# Patient Record
Sex: Female | Born: 1970 | Race: White | Hispanic: No | Marital: Married
Health system: Southern US, Community
[De-identification: ages and names within clinical notes are randomized; demographics above are authoritative.]

---

## 1998-06-27 ENCOUNTER — Other Ambulatory Visit: Admission: RE | Admit: 1998-06-27 | Discharge: 1998-06-27 | Payer: Self-pay | Admitting: Family Medicine

## 1999-06-19 ENCOUNTER — Other Ambulatory Visit: Admission: RE | Admit: 1999-06-19 | Discharge: 1999-06-19 | Payer: Self-pay | Admitting: Obstetrics & Gynecology

## 2000-07-24 ENCOUNTER — Inpatient Hospital Stay (HOSPITAL_COMMUNITY): Admission: AD | Admit: 2000-07-24 | Discharge: 2000-07-26 | Payer: Self-pay | Admitting: Obstetrics & Gynecology

## 2000-07-27 ENCOUNTER — Encounter: Admission: RE | Admit: 2000-07-27 | Discharge: 2000-08-26 | Payer: Self-pay | Admitting: Obstetrics and Gynecology

## 2000-08-28 ENCOUNTER — Other Ambulatory Visit: Admission: RE | Admit: 2000-08-28 | Discharge: 2000-08-28 | Payer: Self-pay | Admitting: Obstetrics and Gynecology

## 2000-09-21 ENCOUNTER — Encounter: Admission: RE | Admit: 2000-09-21 | Discharge: 2000-10-21 | Payer: Self-pay | Admitting: Obstetrics and Gynecology

## 2001-10-29 ENCOUNTER — Other Ambulatory Visit: Admission: RE | Admit: 2001-10-29 | Discharge: 2001-10-29 | Payer: Self-pay | Admitting: Obstetrics and Gynecology

## 2003-01-10 ENCOUNTER — Other Ambulatory Visit: Admission: RE | Admit: 2003-01-10 | Discharge: 2003-01-10 | Payer: Self-pay | Admitting: Obstetrics and Gynecology

## 2004-02-05 ENCOUNTER — Other Ambulatory Visit: Admission: RE | Admit: 2004-02-05 | Discharge: 2004-02-05 | Payer: Self-pay | Admitting: Obstetrics and Gynecology

## 2004-02-07 ENCOUNTER — Ambulatory Visit: Payer: Self-pay | Admitting: Internal Medicine

## 2007-04-13 ENCOUNTER — Other Ambulatory Visit: Admission: RE | Admit: 2007-04-13 | Discharge: 2007-04-13 | Payer: Self-pay | Admitting: Family Medicine

## 2007-04-20 ENCOUNTER — Encounter: Admission: RE | Admit: 2007-04-20 | Discharge: 2007-04-20 | Payer: Self-pay | Admitting: Family Medicine

## 2008-05-22 ENCOUNTER — Other Ambulatory Visit: Admission: RE | Admit: 2008-05-22 | Discharge: 2008-05-22 | Payer: Self-pay | Admitting: Family Medicine

## 2009-08-29 ENCOUNTER — Other Ambulatory Visit: Admission: RE | Admit: 2009-08-29 | Discharge: 2009-08-29 | Payer: Self-pay | Admitting: Family Medicine

## 2011-10-08 ENCOUNTER — Other Ambulatory Visit: Payer: Self-pay | Admitting: Family Medicine

## 2011-10-08 DIAGNOSIS — Z1231 Encounter for screening mammogram for malignant neoplasm of breast: Secondary | ICD-10-CM

## 2011-10-21 ENCOUNTER — Ambulatory Visit
Admission: RE | Admit: 2011-10-21 | Discharge: 2011-10-21 | Disposition: A | Discharge status: Home / Self Care | Payer: BC Managed Care – PPO | Source: Ambulatory Visit | Attending: Family Medicine | Admitting: Family Medicine

## 2011-10-21 DIAGNOSIS — Z1231 Encounter for screening mammogram for malignant neoplasm of breast: Secondary | ICD-10-CM

## 2011-10-24 ENCOUNTER — Other Ambulatory Visit: Payer: Self-pay | Admitting: Family Medicine

## 2011-10-24 DIAGNOSIS — R928 Other abnormal and inconclusive findings on diagnostic imaging of breast: Secondary | ICD-10-CM

## 2012-01-05 ENCOUNTER — Ambulatory Visit
Admission: RE | Admit: 2012-01-05 | Discharge: 2012-01-05 | Disposition: A | Discharge status: Home / Self Care | Payer: BC Managed Care – PPO | Source: Ambulatory Visit | Attending: Family Medicine | Admitting: Family Medicine

## 2012-01-05 DIAGNOSIS — R928 Other abnormal and inconclusive findings on diagnostic imaging of breast: Secondary | ICD-10-CM

## 2012-10-12 ENCOUNTER — Other Ambulatory Visit: Payer: Self-pay | Admitting: Family Medicine

## 2012-10-12 ENCOUNTER — Other Ambulatory Visit (HOSPITAL_COMMUNITY)
Admission: RE | Admit: 2012-10-12 | Discharge: 2012-10-12 | Disposition: A | Discharge status: Home / Self Care | Payer: BC Managed Care – PPO | Source: Ambulatory Visit | Attending: Family Medicine | Admitting: Family Medicine

## 2012-10-12 DIAGNOSIS — Z01419 Encounter for gynecological examination (general) (routine) without abnormal findings: Secondary | ICD-10-CM | POA: Insufficient documentation

## 2013-01-03 ENCOUNTER — Other Ambulatory Visit: Payer: Self-pay

## 2013-01-03 DIAGNOSIS — Z1231 Encounter for screening mammogram for malignant neoplasm of breast: Secondary | ICD-10-CM

## 2013-03-17 ENCOUNTER — Ambulatory Visit
Admission: RE | Admit: 2013-03-17 | Discharge: 2013-03-17 | Disposition: A | Discharge status: Home / Self Care | Payer: BC Managed Care – PPO | Source: Ambulatory Visit

## 2013-03-17 DIAGNOSIS — Z1231 Encounter for screening mammogram for malignant neoplasm of breast: Secondary | ICD-10-CM

## 2013-03-22 ENCOUNTER — Other Ambulatory Visit: Payer: Self-pay | Admitting: Family Medicine

## 2013-03-22 DIAGNOSIS — R928 Other abnormal and inconclusive findings on diagnostic imaging of breast: Secondary | ICD-10-CM

## 2013-05-06 ENCOUNTER — Other Ambulatory Visit: Payer: BC Managed Care – PPO

## 2013-06-13 ENCOUNTER — Ambulatory Visit
Admission: RE | Admit: 2013-06-13 | Discharge: 2013-06-13 | Disposition: A | Discharge status: Home / Self Care | Payer: BC Managed Care – PPO | Source: Ambulatory Visit | Attending: Family Medicine | Admitting: Family Medicine

## 2013-06-13 DIAGNOSIS — R928 Other abnormal and inconclusive findings on diagnostic imaging of breast: Secondary | ICD-10-CM

## 2015-10-17 ENCOUNTER — Other Ambulatory Visit: Payer: Self-pay | Admitting: Family Medicine

## 2015-10-17 DIAGNOSIS — Z1231 Encounter for screening mammogram for malignant neoplasm of breast: Secondary | ICD-10-CM

## 2016-03-24 ENCOUNTER — Ambulatory Visit
Admission: RE | Admit: 2016-03-24 | Discharge: 2016-03-24 | Disposition: A | Discharge status: Home / Self Care | Payer: BC Managed Care – PPO | Source: Ambulatory Visit | Attending: Family Medicine | Admitting: Family Medicine

## 2016-03-24 DIAGNOSIS — Z1231 Encounter for screening mammogram for malignant neoplasm of breast: Secondary | ICD-10-CM

## 2016-04-21 ENCOUNTER — Other Ambulatory Visit: Payer: Self-pay | Admitting: Family Medicine

## 2016-04-21 ENCOUNTER — Other Ambulatory Visit (HOSPITAL_COMMUNITY)
Admission: RE | Admit: 2016-04-21 | Discharge: 2016-04-21 | Disposition: A | Discharge status: Home / Self Care | Payer: BC Managed Care – PPO | Source: Ambulatory Visit | Attending: Family Medicine | Admitting: Family Medicine

## 2016-04-21 DIAGNOSIS — Z01419 Encounter for gynecological examination (general) (routine) without abnormal findings: Secondary | ICD-10-CM | POA: Insufficient documentation

## 2016-04-23 LAB — CYTOLOGY - PAP: DIAGNOSIS: NEGATIVE

## 2019-08-25 ENCOUNTER — Other Ambulatory Visit: Payer: Self-pay | Admitting: Family Medicine

## 2019-08-25 DIAGNOSIS — Z1231 Encounter for screening mammogram for malignant neoplasm of breast: Secondary | ICD-10-CM

## 2019-08-26 ENCOUNTER — Other Ambulatory Visit: Payer: Self-pay

## 2019-08-26 ENCOUNTER — Ambulatory Visit
Admission: RE | Admit: 2019-08-26 | Discharge: 2019-08-26 | Disposition: A | Discharge status: Home / Self Care | Payer: BC Managed Care – PPO | Source: Ambulatory Visit | Attending: Family Medicine | Admitting: Family Medicine

## 2019-08-26 DIAGNOSIS — Z1231 Encounter for screening mammogram for malignant neoplasm of breast: Secondary | ICD-10-CM

## 2020-09-17 ENCOUNTER — Other Ambulatory Visit: Payer: Self-pay | Admitting: Family Medicine

## 2020-09-17 DIAGNOSIS — Z1231 Encounter for screening mammogram for malignant neoplasm of breast: Secondary | ICD-10-CM

## 2021-01-15 ENCOUNTER — Ambulatory Visit: Payer: BC Managed Care – PPO

## 2021-10-14 ENCOUNTER — Other Ambulatory Visit: Payer: Self-pay | Admitting: Family Medicine

## 2021-10-14 DIAGNOSIS — Z1231 Encounter for screening mammogram for malignant neoplasm of breast: Secondary | ICD-10-CM

## 2021-10-18 ENCOUNTER — Ambulatory Visit
Admission: RE | Admit: 2021-10-18 | Discharge: 2021-10-18 | Disposition: A | Discharge status: Home / Self Care | Payer: BC Managed Care – PPO | Source: Ambulatory Visit | Attending: Family Medicine | Admitting: Family Medicine

## 2021-10-18 DIAGNOSIS — Z1231 Encounter for screening mammogram for malignant neoplasm of breast: Secondary | ICD-10-CM

## 2022-04-06 IMAGING — MG DIGITAL SCREENING BILAT W/ TOMO W/ CAD
6 of 10 series · 6 of 30 positions shown · non-contrast
Comparison: Previous exam(s).

CLINICAL DATA: Screening.

EXAM:
DIGITAL SCREENING BILATERAL MAMMOGRAM WITH TOMO AND CAD

[R CC synth-2D (1 of 2)]
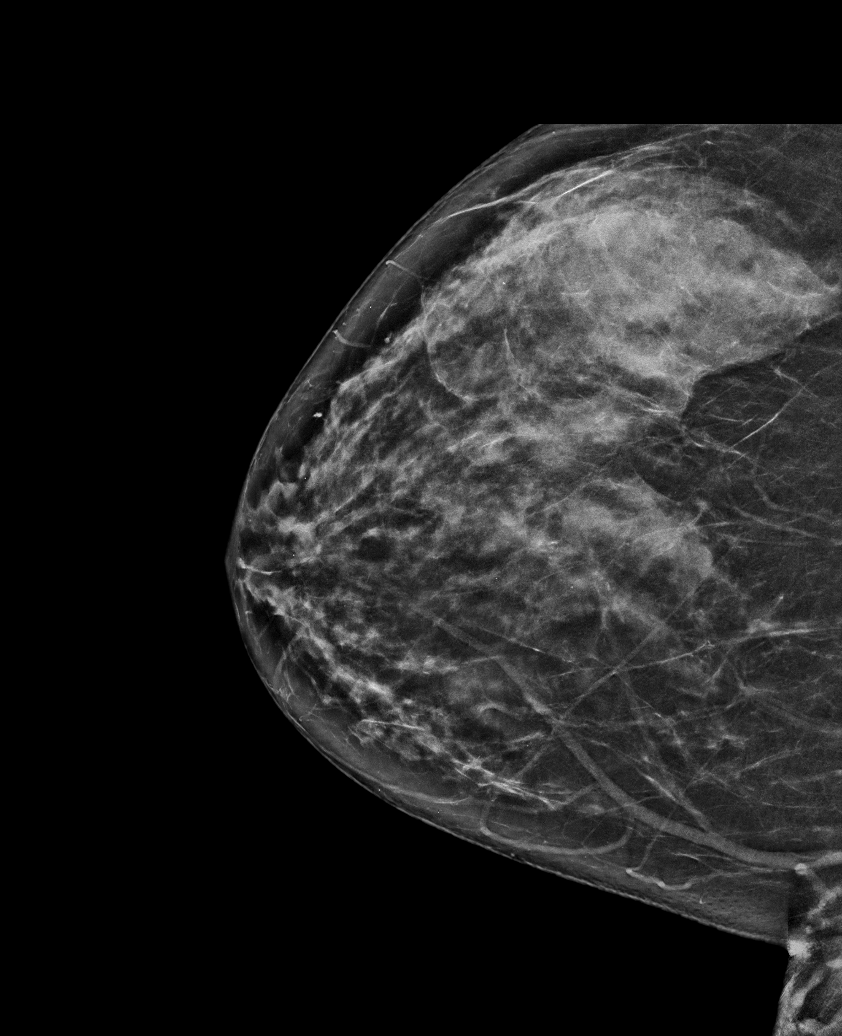

[R MLO synth-2D]
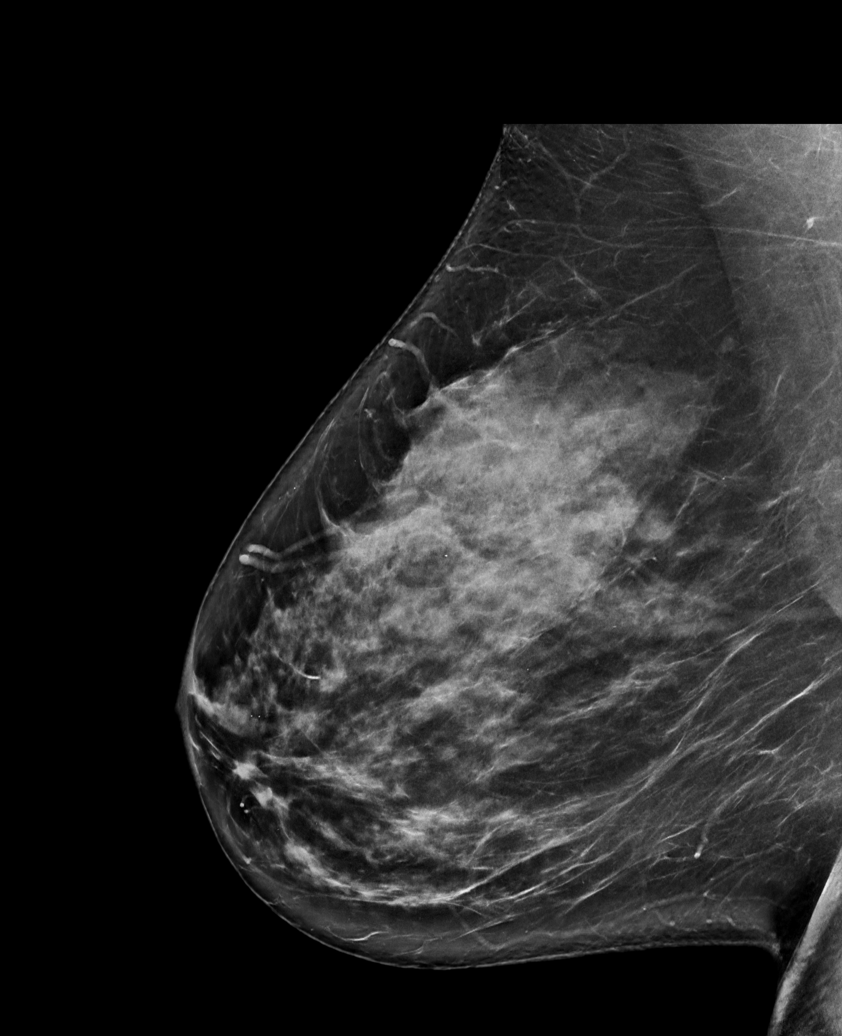

[R CC synth-2D (2 of 2)]
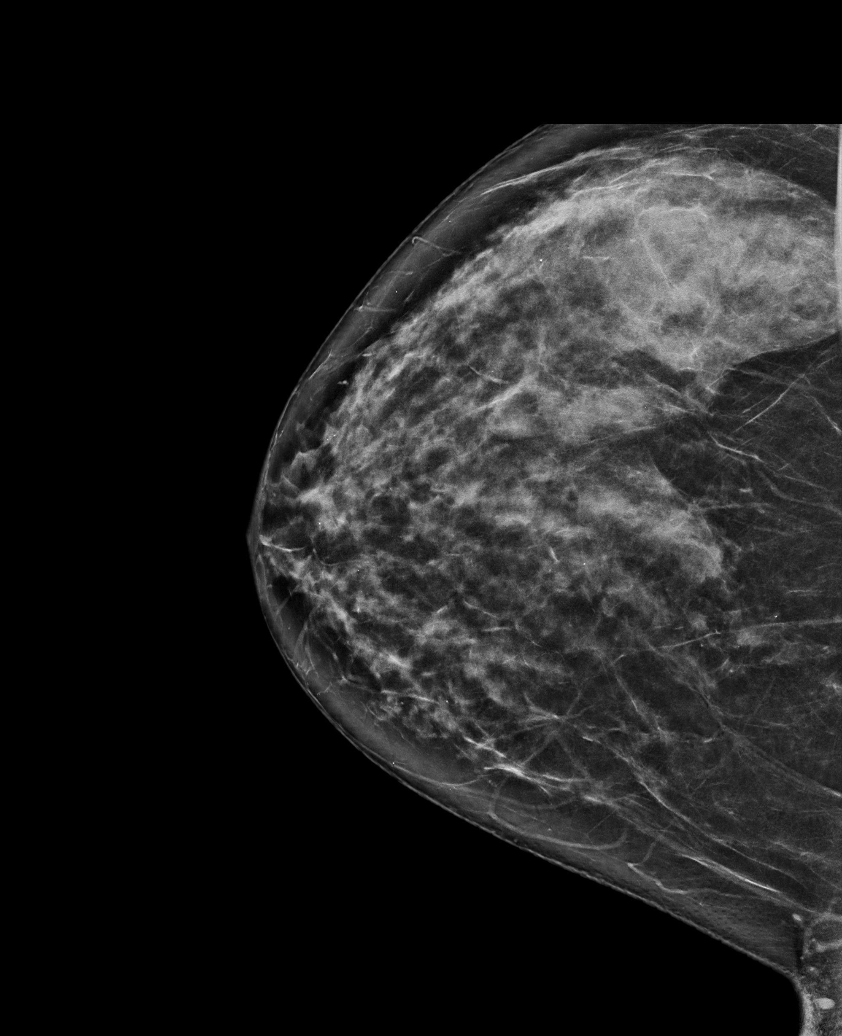

[L MLO synth-2D]
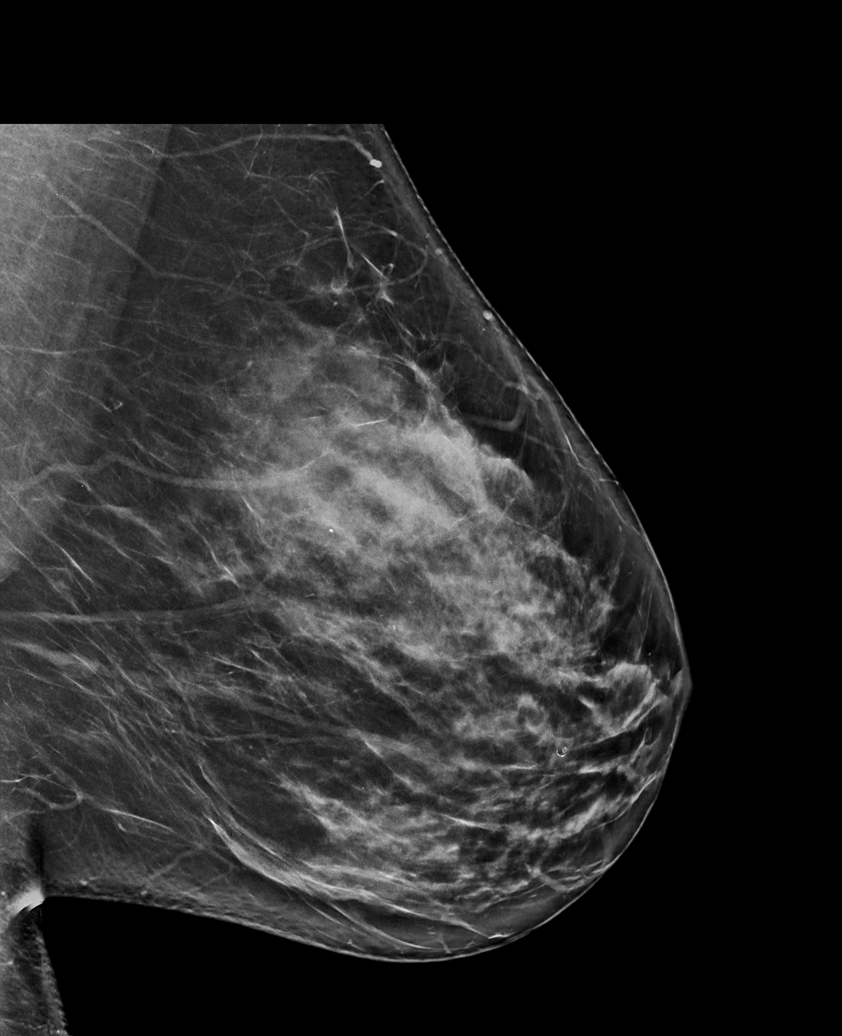

[L CC synth-2D]
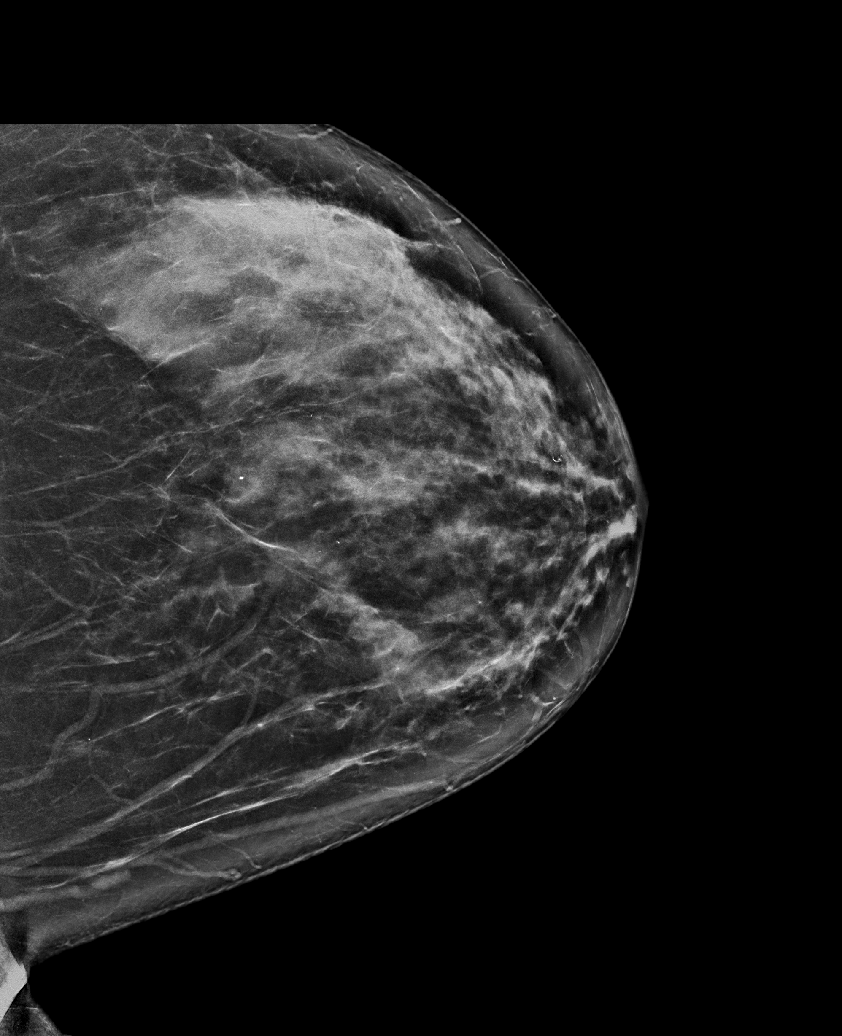

[L CC tomo · tomo slice 43/86.0]
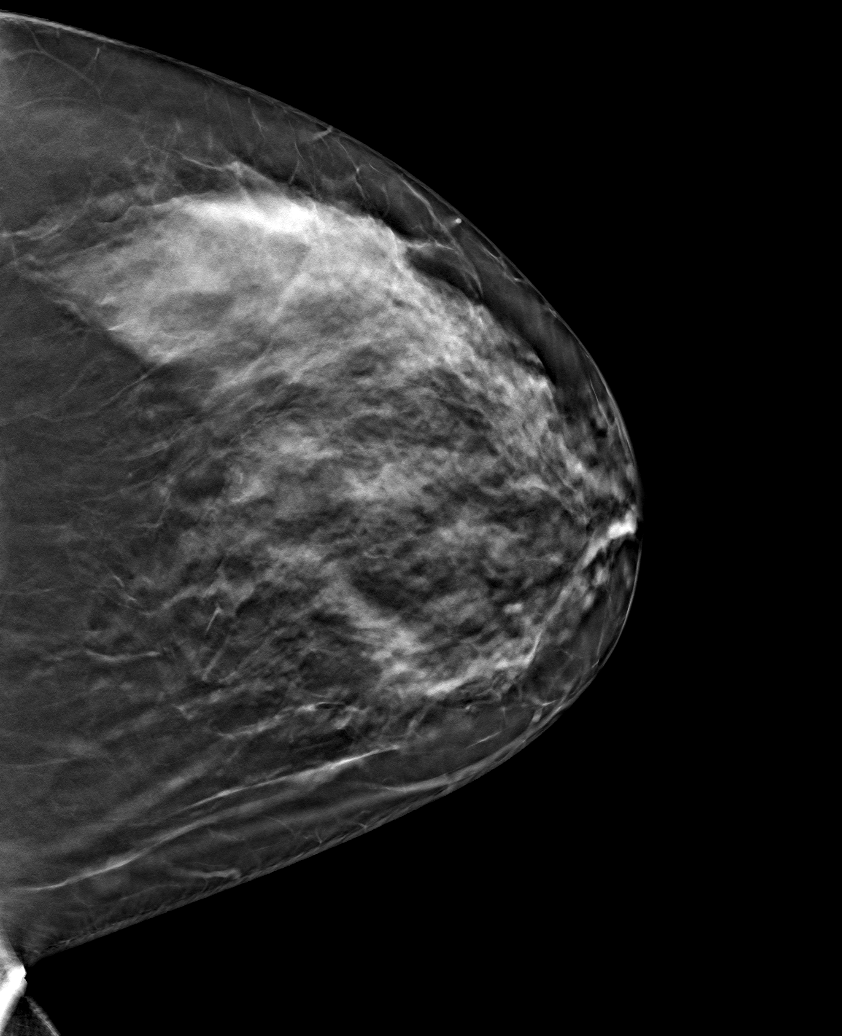

[6 of 30 positions shown; findings below may reference images not displayed]

ACR Breast Density Category c: The breast tissue is heterogeneously
dense, which may obscure small masses.
FINDINGS: There are no findings suspicious for malignancy. Images were
processed with CAD.
IMPRESSION: No mammographic evidence of malignancy. A result letter of this
screening mammogram will be mailed directly to the patient.

RECOMMENDATION:
Screening mammogram in one year. (Code:[id number])

BI-RADS CATEGORY  1: Negative.

## 2022-09-22 ENCOUNTER — Other Ambulatory Visit: Payer: Self-pay | Admitting: Family Medicine

## 2022-09-22 DIAGNOSIS — Z1231 Encounter for screening mammogram for malignant neoplasm of breast: Secondary | ICD-10-CM

## 2022-10-20 ENCOUNTER — Ambulatory Visit
Admission: RE | Admit: 2022-10-20 | Discharge: 2022-10-20 | Disposition: A | Discharge status: Home / Self Care | Payer: BC Managed Care – PPO | Source: Ambulatory Visit | Attending: Family Medicine | Admitting: Family Medicine

## 2022-10-20 DIAGNOSIS — Z1231 Encounter for screening mammogram for malignant neoplasm of breast: Secondary | ICD-10-CM

## 2022-10-23 ENCOUNTER — Other Ambulatory Visit: Payer: Self-pay | Admitting: Family Medicine

## 2022-10-23 DIAGNOSIS — R928 Other abnormal and inconclusive findings on diagnostic imaging of breast: Secondary | ICD-10-CM

## 2022-10-31 ENCOUNTER — Other Ambulatory Visit: Payer: Self-pay | Admitting: Family Medicine

## 2022-10-31 ENCOUNTER — Ambulatory Visit
Admission: RE | Admit: 2022-10-31 | Discharge: 2022-10-31 | Disposition: A | Discharge status: Home / Self Care | Payer: BC Managed Care – PPO | Source: Ambulatory Visit | Attending: Family Medicine | Admitting: Family Medicine

## 2022-10-31 DIAGNOSIS — R921 Mammographic calcification found on diagnostic imaging of breast: Secondary | ICD-10-CM

## 2022-10-31 DIAGNOSIS — R928 Other abnormal and inconclusive findings on diagnostic imaging of breast: Secondary | ICD-10-CM

## 2023-04-10 ENCOUNTER — Ambulatory Visit
Admission: RE | Admit: 2023-04-10 | Discharge: 2023-04-10 | Disposition: A | Discharge status: Home / Self Care | Payer: 59 | Source: Ambulatory Visit | Attending: Family Medicine | Admitting: Family Medicine

## 2023-04-10 DIAGNOSIS — R921 Mammographic calcification found on diagnostic imaging of breast: Secondary | ICD-10-CM

## 2023-09-07 ENCOUNTER — Other Ambulatory Visit: Payer: Self-pay | Admitting: Family Medicine

## 2023-09-07 DIAGNOSIS — R921 Mammographic calcification found on diagnostic imaging of breast: Secondary | ICD-10-CM

## 2023-09-25 ENCOUNTER — Encounter: Payer: Self-pay | Admitting: Advanced Practice Midwife

## 2023-10-23 ENCOUNTER — Ambulatory Visit
Admission: RE | Admit: 2023-10-23 | Discharge: 2023-10-23 | Disposition: A | Discharge status: Home / Self Care | Source: Ambulatory Visit | Attending: Family Medicine | Admitting: Family Medicine

## 2023-10-23 ENCOUNTER — Other Ambulatory Visit (HOSPITAL_BASED_OUTPATIENT_CLINIC_OR_DEPARTMENT_OTHER): Payer: Self-pay | Admitting: Family Medicine

## 2023-10-23 DIAGNOSIS — R921 Mammographic calcification found on diagnostic imaging of breast: Secondary | ICD-10-CM

## 2023-10-23 DIAGNOSIS — Z8249 Family history of ischemic heart disease and other diseases of the circulatory system: Secondary | ICD-10-CM

## 2023-11-13 ENCOUNTER — Ambulatory Visit (HOSPITAL_BASED_OUTPATIENT_CLINIC_OR_DEPARTMENT_OTHER)
Admission: RE | Admit: 2023-11-13 | Discharge: 2023-11-13 | Disposition: A | Discharge status: Home / Self Care | Payer: Self-pay | Source: Ambulatory Visit | Attending: Family Medicine | Admitting: Family Medicine

## 2023-11-13 DIAGNOSIS — Z8249 Family history of ischemic heart disease and other diseases of the circulatory system: Secondary | ICD-10-CM | POA: Insufficient documentation
